# Patient Record
Sex: Female | Born: 1990
Health system: Southern US, Community
[De-identification: ages and names within clinical notes are randomized; demographics above are authoritative.]

## PROBLEM LIST (undated history)

## (undated) DIAGNOSIS — Z8619 Personal history of other infectious and parasitic diseases: Secondary | ICD-10-CM

## (undated) DIAGNOSIS — Z789 Other specified health status: Secondary | ICD-10-CM

## (undated) HISTORY — PX: NO PAST SURGERIES: SHX2092

## (undated) HISTORY — DX: Personal history of other infectious and parasitic diseases: Z86.19

## (undated) HISTORY — DX: Other specified health status: Z78.9

---
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
## 2008-11-09 ENCOUNTER — Emergency Department (HOSPITAL_COMMUNITY): Admission: EM | Admit: 2008-11-09 | Discharge: 2008-11-09 | Payer: Self-pay | Admitting: Emergency Medicine
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
## 2010-03-31 ENCOUNTER — Emergency Department (HOSPITAL_COMMUNITY): Admission: EM | Admit: 2010-03-31 | Discharge: 2010-03-31 | Payer: Self-pay | Admitting: Emergency Medicine

## 2010-06-05 ENCOUNTER — Emergency Department (HOSPITAL_COMMUNITY): Admission: EM | Admit: 2010-06-05 | Discharge: 2010-01-24 | Payer: Self-pay | Admitting: Emergency Medicine

## 2010-10-07 LAB — POCT PREGNANCY, URINE: Preg Test, Ur: NEGATIVE

## 2018-04-13 DIAGNOSIS — Z6830 Body mass index (BMI) 30.0-30.9, adult: Secondary | ICD-10-CM | POA: Diagnosis not present

## 2018-05-10 DIAGNOSIS — Z6834 Body mass index (BMI) 34.0-34.9, adult: Secondary | ICD-10-CM | POA: Diagnosis not present

## 2018-05-10 DIAGNOSIS — Z01419 Encounter for gynecological examination (general) (routine) without abnormal findings: Secondary | ICD-10-CM | POA: Diagnosis not present

## 2018-05-10 DIAGNOSIS — Z124 Encounter for screening for malignant neoplasm of cervix: Secondary | ICD-10-CM | POA: Diagnosis not present

## 2018-05-11 ENCOUNTER — Encounter: Payer: Self-pay | Admitting: Family Medicine

## 2018-05-11 ENCOUNTER — Ambulatory Visit (INDEPENDENT_AMBULATORY_CARE_PROVIDER_SITE_OTHER): Payer: BLUE CROSS/BLUE SHIELD | Admitting: Family Medicine

## 2018-05-11 VITALS — BP 122/80 | HR 83 | Temp 98.9°F | Resp 12 | Ht 65.0 in | Wt 214.2 lb

## 2018-05-11 DIAGNOSIS — I499 Cardiac arrhythmia, unspecified: Secondary | ICD-10-CM | POA: Diagnosis not present

## 2018-05-11 DIAGNOSIS — Z23 Encounter for immunization: Secondary | ICD-10-CM

## 2018-05-11 DIAGNOSIS — Z124 Encounter for screening for malignant neoplasm of cervix: Secondary | ICD-10-CM | POA: Diagnosis not present

## 2018-05-11 DIAGNOSIS — E669 Obesity, unspecified: Secondary | ICD-10-CM

## 2018-05-11 DIAGNOSIS — F17200 Nicotine dependence, unspecified, uncomplicated: Secondary | ICD-10-CM | POA: Diagnosis not present

## 2018-05-11 DIAGNOSIS — E66812 Obesity, class 2: Secondary | ICD-10-CM | POA: Insufficient documentation

## 2018-05-11 NOTE — Progress Notes (Signed)
HPI:   Dawn Conley is a 27 y.o. female, who is here today to establish care.  Former PCP: N/A Last preventive routine visit: Gyn preventive visit in 04/2018.  Chronic medical problems: Headaches,which she states improved after stopping multivitamins. She had labs through employer and she was told they were "fine." +Tobacco use.  She has no concerns today.  On examination noted mild arrhythmia. She denies chest pain,palpitations,dyspnea,diaphoresis,or dizziness.  Negative for abnormal wt loss, instead she reports gaining wt . No fever,chills,abdominal pain,changes in bowel habits,or tremor.   She does not exercise regularly and does not follow a healthful diet.  She does not have concerns today.     Review of Systems  Constitutional: Negative for activity change, appetite change, fatigue and fever.  HENT: Negative for mouth sores, nosebleeds and trouble swallowing.   Eyes: Negative for redness and visual disturbance.  Respiratory: Negative for cough, shortness of breath and wheezing.   Cardiovascular: Negative for chest pain, palpitations and leg swelling.  Gastrointestinal: Negative for abdominal pain, nausea and vomiting.       Negative for changes in bowel habits.  Endocrine: Negative for cold intolerance and heat intolerance.  Genitourinary: Negative for decreased urine volume, dysuria and hematuria.  Neurological: Negative for syncope, weakness and headaches.      No current outpatient medications on file prior to visit.   No current facility-administered medications on file prior to visit.      Past Medical History:  Diagnosis Date  . History of chicken pox    Allergies  Allergen Reactions  . Aspirin Hives    Family History  Problem Relation Age of Onset  . Drug abuse Mother   . Drug abuse Father   . Alcohol abuse Father   . Early death Father   . Heart disease Father   . Heart attack Father   . Diabetes Maternal Grandmother    . Hypertension Maternal Grandmother   . Stroke Maternal Grandfather     Social History   Socioeconomic History  . Marital status: Single    Spouse name: Not on file  . Number of children: Not on file  . Years of education: Not on file  . Highest education level: Not on file  Occupational History  . Not on file  Social Needs  . Financial resource strain: Not on file  . Food insecurity:    Worry: Not on file    Inability: Not on file  . Transportation needs:    Medical: Not on file    Non-medical: Not on file  Tobacco Use  . Smoking status: Current Every Day Smoker  . Smokeless tobacco: Never Used  Substance and Sexual Activity  . Alcohol use: Yes  . Drug use: Never  . Sexual activity: Not Currently  Lifestyle  . Physical activity:    Days per week: Not on file    Minutes per session: Not on file  . Stress: Not on file  Relationships  . Social connections:    Talks on phone: Not on file    Gets together: Not on file    Attends religious service: Not on file    Active member of club or organization: Not on file    Attends meetings of clubs or organizations: Not on file    Relationship status: Not on file  Other Topics Concern  . Not on file  Social History Narrative  . Not on file    Vitals:   05/11/18  1533  BP: 122/80  Pulse: 83  Resp: 12  Temp: 98.9 F (37.2 C)  SpO2: 95%    Body mass index is 35.65 kg/m.   Physical Exam  Nursing note and vitals reviewed. Constitutional: She is oriented to person, place, and time. She appears well-developed. No distress.  HENT:  Head: Normocephalic and atraumatic.  Mouth/Throat: Oropharynx is clear and moist and mucous membranes are normal.  Eyes: Pupils are equal, round, and reactive to light. Conjunctivae are normal.  Cardiovascular: Normal rate. An irregular rhythm present.  Occasional extrasystoles are present.  No murmur heard. Pulses:      Dorsalis pedis pulses are 2+ on the right side, and 2+ on the left  side.  Respiratory: Effort normal and breath sounds normal. No respiratory distress.  GI: Soft. She exhibits no mass. There is no hepatomegaly. There is no tenderness.  Musculoskeletal: She exhibits no edema.  Lymphadenopathy:    She has no cervical adenopathy.  Neurological: She is alert and oriented to person, place, and time. She has normal strength. No cranial nerve deficit. Gait normal.  Skin: Skin is warm. No rash noted. No erythema.  Psychiatric: She has a normal mood and affect.  Well groomed, good eye contact.      ASSESSMENT AND PLAN:   Dawn Conley was seen today for establish care.  Diagnoses and all orders for this visit:  Irregular heart rate Mild and asymptomatic. Possible etiologies discussed. EKG today sinus arrhythmia, LAD, +artefact. Some T wave abnormalities inferior leads, rest otherwise normal.No another EKG available for comparison.  No further work up ordered today. She had labs at work 01/2018, will fax results tomorrow. Will bring her back for lab work if needed.  Instructed about warning signs.  -     EKG 12-Lead  Obesity, Class II, BMI 35-39.9 We discussed benefits of wt loss as well as adverse effects of obesity. Consistency with healthy diet and physical activity recommended.  Tobacco use disorder Adverse effects of tobacco use and benefit os smoking cessation discussed. She is not interested in smoking cessation at this time.   Need for Tdap vaccination -     Tdap vaccine greater than or equal to 7yo IM     Betty G. [place], MD  [facility]. [place] office.

## 2018-05-11 NOTE — Patient Instructions (Addendum)
A few things to remember from today's visit:   Irregular heart rate - Plan: EKG 12-Lead   Mediterranean Diet A Mediterranean diet refers to food and lifestyle choices that are based on the traditions of countries located on the [place]. This way of eating has been shown to help prevent certain conditions and improve outcomes for people who have chronic diseases, like kidney disease and heart disease. What are tips for following this plan? Lifestyle  Cook and eat meals together with your family, when possible.  Drink enough fluid to keep your urine clear or pale yellow.  Be physically active every day. This includes: ? Aerobic exercise like running or swimming. ? Leisure activities like gardening, walking, or housework.  Get 7-8 hours of sleep each night.  If recommended by your health care provider, drink red wine in moderation. This means 1 glass a day for nonpregnant women and 2 glasses a day for men. A glass of wine equals 5 oz (150 mL). Reading food labels  Check the serving size of packaged foods. For foods such as rice and pasta, the serving size refers to the amount of cooked product, not dry.  Check the total fat in packaged foods. Avoid foods that have saturated fat or trans fats.  Check the ingredients list for added sugars, such as corn syrup. Shopping  At the grocery store, buy most of your food from the areas near the walls of the store. This includes: ? Fresh fruits and vegetables (produce). ? Grains, beans, nuts, and seeds. Some of these may be available in unpackaged forms or large amounts (in bulk). ? Fresh seafood. ? Poultry and eggs. ? Low-fat dairy products.  Buy whole ingredients instead of prepackaged foods.  Buy fresh fruits and vegetables in-season from local farmers markets.  Buy frozen fruits and vegetables in resealable bags.  If you do not have access to quality fresh seafood, buy precooked frozen shrimp or canned fish, such as tuna,  salmon, or sardines.  Buy small amounts of raw or cooked vegetables, salads, or olives from the deli or salad bar at your store.  Stock your pantry so you always have certain foods on hand, such as olive oil, canned tuna, canned tomatoes, rice, pasta, and beans. Cooking  Cook foods with extra-virgin olive oil instead of using butter or other vegetable oils.  Have meat as a side dish, and have vegetables or grains as your main dish. This means having meat in small portions or adding small amounts of meat to foods like pasta or stew.  Use beans or vegetables instead of meat in common dishes like chili or lasagna.  Experiment with different cooking methods. Try roasting or broiling vegetables instead of steaming or sauteing them.  Add frozen vegetables to soups, stews, pasta, or rice.  Add nuts or seeds for added healthy fat at each meal. You can add these to yogurt, salads, or vegetable dishes.  Marinate fish or vegetables using olive oil, lemon juice, garlic, and fresh herbs. Meal planning  Plan to eat 1 vegetarian meal one day each week. Try to work up to 2 vegetarian meals, if possible.  Eat seafood 2 or more times a week.  Have healthy snacks readily available, such as: ? Vegetable sticks with hummus. ? [place] yogurt. ? Fruit and nut trail mix.  Eat balanced meals throughout the week. This includes: ? Fruit: 2-3 servings a day ? Vegetables: 4-5 servings a day ? Low-fat dairy: 2 servings a day ? Fish, poultry,  or lean meat: 1 serving a day ? Beans and legumes: 2 or more servings a week ? Nuts and seeds: 1-2 servings a day ? Whole grains: 6-8 servings a day ? Extra-virgin olive oil: 3-4 servings a day  Limit red meat and sweets to only a few servings a month What are my food choices?  Mediterranean diet ? Recommended ? Grains: Whole-grain pasta. Brown rice. Bulgar wheat. Polenta. Couscous. Whole-wheat bread. Orpah Cobb. ? Vegetables: Artichokes. Beets. Broccoli.  Cabbage. Carrots. Eggplant. Green beans. Chard. Kale. Spinach. Onions. Leeks. Peas. Squash. Tomatoes. Peppers. Radishes. ? Fruits: Apples. Apricots. Avocado. Berries. Bananas. Cherries. Dates. Figs. Grapes. Lemons. Melon. Oranges. Peaches. Plums. Pomegranate. ? Meats and other protein foods: Beans. Almonds. Sunflower seeds. Pine nuts. Peanuts. Cod. Salmon. Scallops. Shrimp. Tuna. Tilapia. Clams. Oysters. Eggs. ? Dairy: Low-fat milk. Cheese. Greek yogurt. ? Beverages: Water. Red wine. Herbal tea. ? Fats and oils: Extra virgin olive oil. Avocado oil. Grape seed oil. ? Sweets and desserts: [place] yogurt with honey. Baked apples. Poached pears. Trail mix. ? Seasoning and other foods: Basil. Cilantro. Coriander. Cumin. Mint. Parsley. Sage. Rosemary. Tarragon. Garlic. Oregano. Thyme. Pepper. Balsalmic vinegar. Tahini. Hummus. Tomato sauce. Olives. Mushrooms. ? Limit these ? Grains: Prepackaged pasta or rice dishes. Prepackaged cereal with added sugar. ? Vegetables: Deep fried potatoes (french fries). ? Fruits: Fruit canned in syrup. ? Meats and other protein foods: Beef. Pork. Lamb. Poultry with skin. Hot dogs. Tomasa Blase. ? Dairy: Ice cream. Sour cream. Whole milk. ? Beverages: Juice. Sugar-sweetened soft drinks. Beer. Liquor and spirits. ? Fats and oils: Butter. Canola oil. Vegetable oil. Beef fat (tallow). Lard. ? Sweets and desserts: Cookies. Cakes. Pies. Candy. ? Seasoning and other foods: Mayonnaise. Premade sauces and marinades. ? The items listed may not be a complete list. Talk with your dietitian about what dietary choices are right for you. Summary  The Mediterranean diet includes both food and lifestyle choices.  Eat a variety of fresh fruits and vegetables, beans, nuts, seeds, and whole grains.  Limit the amount of red meat and sweets that you eat.  Talk with your health care provider about whether it is safe for you to drink red wine in moderation. This means 1 glass a day for  nonpregnant women and 2 glasses a day for men. A glass of wine equals 5 oz (150 mL). This information is not intended to replace advice given to you by your health care provider. Make sure you discuss any questions you have with your health care provider. Document Released: 02/06/2016 Document Revised: 03/10/2016 Document Reviewed: 02/06/2016 Elsevier Interactive Patient Education  [employer].   Please be sure medication list is accurate. If a new problem present, please set up appointment sooner than planned today.

## 2018-05-12 DIAGNOSIS — F17200 Nicotine dependence, unspecified, uncomplicated: Secondary | ICD-10-CM | POA: Insufficient documentation

## 2019-06-19 ENCOUNTER — Ambulatory Visit: Payer: Managed Care, Other (non HMO) | Attending: Internal Medicine

## 2019-06-19 DIAGNOSIS — Z20822 Contact with and (suspected) exposure to covid-19: Secondary | ICD-10-CM

## 2019-06-20 LAB — NOVEL CORONAVIRUS, NAA: SARS-CoV-2, NAA: NOT DETECTED

## 2021-06-29 NOTE — L&D Delivery Note (Signed)
Delivery Note    Patient Name: Dawn Conley DOB: [birth year] MRN: [id number]  Date of admission: 02/28/2022 Delivering MD: Dale Omar  Date of delivery: 03/01/2022 Type of delivery: SVD  Newborn Data: Live born female  Birth Weight:   APGAR: 8, 9  Newborn Delivery   Birth date/time: 03/01/2022 00:02:00 Delivery type: Vaginal, Spontaneous      Dawn Conley, 31 y.o., @ [redacted]w[redacted]d,  G4P0030, who was admitted for IOL for NRFHT. I was called to the room when she progressed +1 station in the second stage of labor.  She pushed for 20/min.  She delivered a viable infant, cephalic and restituted to the LOA position over an intact perineum.  A tight nuchal cord   was identified and unable to reduced NB summersault through and x1 loose body cord. The baby was placed on maternal abdomen while initial step of NRP were perfmored (Dry, Stimulated, and warmed). Hat placed on baby for thermoregulation. Delayed cord clamping was performed for 2 minutes.  Cord double clamped and cut.  Cord cut by FOB. Apgar scores were 8 and 9. Prophylactic Pitocin was started in the third stage of labor for active management. The placenta delivered spontaneously, shultz, with a 3 vessel cord and was sent home with pt.  Inspection revealed 2nd degree and with sulcus, brisk bleeding was noted, TXA hung r/t brisk bleeding, but most likely blood loss was from the sulcus. An examination of the vaginal vault and cervix was free from lacerations. The uterus was firm, bleeding stable.  The repair was done under epidural and lidocaine.  Umbilical artery blood gas were not sent.  There were no complications during the procedure.  Mom and baby skin to skin following delivery. Left in stable condition.  Maternal Info: Anesthesia: Epidural Episiotomy: no Lacerations:  2nd with sulcus Suture Repair: 2.0 vicryl Est. Blood Loss (mL):   Newborn Info:  Baby Sex: female Circumcision: in pt desired APGAR (1 MIN): 8   APGAR  (5 MINS): 9   APGAR (10 MINS):    Dr Richardson Dopp was texted updates. Pt stable.   Mom to postpartum.  Baby to Couplet care / Skin to Skin.   [facility] CNM, FNP-C, PMHNP-BC  [address]  [place], [place] [address]  Cell: [phone]  Office Phone: [phone] Fax: [phone] 03/01/2022  12:43 AM

## 2021-07-24 LAB — OB RESULTS CONSOLE HEPATITIS B SURFACE ANTIGEN: Hepatitis B Surface Ag: NEGATIVE

## 2021-07-24 LAB — OB RESULTS CONSOLE HIV ANTIBODY (ROUTINE TESTING): HIV: NONREACTIVE

## 2022-02-28 ENCOUNTER — Inpatient Hospital Stay (HOSPITAL_COMMUNITY): Payer: No Typology Code available for payment source | Admitting: Anesthesiology

## 2022-02-28 ENCOUNTER — Inpatient Hospital Stay (HOSPITAL_COMMUNITY)
Admission: AD | Admit: 2022-02-28 | Discharge: 2022-03-03 | DRG: 807 | Disposition: A | Discharge status: Home / Self Care | Payer: No Typology Code available for payment source | Attending: Obstetrics and Gynecology | Admitting: Obstetrics and Gynecology

## 2022-02-28 ENCOUNTER — Encounter (HOSPITAL_COMMUNITY): Payer: Self-pay | Admitting: Obstetrics and Gynecology

## 2022-02-28 DIAGNOSIS — O480 Post-term pregnancy: Secondary | ICD-10-CM | POA: Diagnosis present

## 2022-02-28 DIAGNOSIS — Z3490 Encounter for supervision of normal pregnancy, unspecified, unspecified trimester: Secondary | ICD-10-CM | POA: Diagnosis present

## 2022-02-28 DIAGNOSIS — O26893 Other specified pregnancy related conditions, third trimester: Secondary | ICD-10-CM | POA: Diagnosis present

## 2022-02-28 DIAGNOSIS — F17200 Nicotine dependence, unspecified, uncomplicated: Secondary | ICD-10-CM | POA: Diagnosis present

## 2022-02-28 DIAGNOSIS — O368390 Maternal care for abnormalities of the fetal heart rate or rhythm, unspecified trimester, not applicable or unspecified: Secondary | ICD-10-CM | POA: Diagnosis present

## 2022-02-28 DIAGNOSIS — O99334 Smoking (tobacco) complicating childbirth: Secondary | ICD-10-CM | POA: Diagnosis present

## 2022-02-28 DIAGNOSIS — O99214 Obesity complicating childbirth: Secondary | ICD-10-CM | POA: Diagnosis present

## 2022-02-28 DIAGNOSIS — Z3A40 40 weeks gestation of pregnancy: Secondary | ICD-10-CM | POA: Diagnosis not present

## 2022-02-28 LAB — CBC
HCT: 35.2 % — ABNORMAL LOW (ref 36.0–46.0)
Hemoglobin: 12.2 g/dL (ref 12.0–15.0)
MCH: 31.6 pg (ref 26.0–34.0)
MCHC: 34.7 g/dL (ref 30.0–36.0)
MCV: 91.2 fL (ref 80.0–100.0)
Platelets: 209 10*3/uL (ref 150–400)
RBC: 3.86 MIL/uL — ABNORMAL LOW (ref 3.87–5.11)
RDW: 13.7 % (ref 11.5–15.5)
WBC: 11 10*3/uL — ABNORMAL HIGH (ref 4.0–10.5)
nRBC: 0 % (ref 0.0–0.2)

## 2022-02-28 LAB — TYPE AND SCREEN
ABO/RH(D): O POS
Antibody Screen: NEGATIVE

## 2022-02-28 LAB — RPR: RPR Ser Ql: NONREACTIVE

## 2022-02-28 MED ORDER — FENTANYL CITRATE (PF) 100 MCG/2ML IJ SOLN
50.0000 ug | INTRAMUSCULAR | Status: DC | PRN
  Administered 2022-02-28: 100 ug via INTRAVENOUS
  Filled 2022-02-28: qty 2

## 2022-02-28 MED ORDER — OXYTOCIN-SODIUM CHLORIDE 30-0.9 UT/500ML-% IV SOLN
2.5000 [IU]/h | INTRAVENOUS | Status: DC
  Administered 2022-03-01: 2.5 [IU]/h via INTRAVENOUS
  Filled 2022-02-28: qty 500

## 2022-02-28 MED ORDER — ACETAMINOPHEN 325 MG PO TABS: 650.0000 mg | ORAL_TABLET | ORAL | Status: DC | PRN

## 2022-02-28 MED ORDER — TERBUTALINE SULFATE 1 MG/ML IJ SOLN: 0.2500 mg | Freq: Once | INTRAMUSCULAR | Status: DC | PRN

## 2022-02-28 MED ORDER — LACTATED RINGERS IV SOLN: 500.0000 mL | Freq: Once | INTRAVENOUS | Status: DC

## 2022-02-28 MED ORDER — LACTATED RINGERS IV SOLN
500.0000 mL | INTRAVENOUS | Status: DC | PRN
  Administered 2022-02-28: 1000 mL via INTRAVENOUS

## 2022-02-28 MED ORDER — FENTANYL-BUPIVACAINE-NACL 0.5-0.125-0.9 MG/250ML-% EP SOLN
12.0000 mL/h | EPIDURAL | Status: DC | PRN
  Administered 2022-02-28: 12 mL/h via EPIDURAL
  Filled 2022-02-28: qty 250

## 2022-02-28 MED ORDER — OXYCODONE-ACETAMINOPHEN 5-325 MG PO TABS: 1.0000 | ORAL_TABLET | ORAL | Status: DC | PRN

## 2022-02-28 MED ORDER — PHENYLEPHRINE 80 MCG/ML (10ML) SYRINGE FOR IV PUSH (FOR BLOOD PRESSURE SUPPORT)
80.0000 ug | PREFILLED_SYRINGE | INTRAVENOUS | Status: DC | PRN
  Filled 2022-02-28: qty 10

## 2022-02-28 MED ORDER — ONDANSETRON HCL 4 MG/2ML IJ SOLN
4.0000 mg | Freq: Four times a day (QID) | INTRAMUSCULAR | Status: DC | PRN
Start: 2022-02-28 — End: 2022-03-01
  Administered 2022-02-28: 4 mg via INTRAVENOUS
  Filled 2022-02-28: qty 2

## 2022-02-28 MED ORDER — OXYCODONE-ACETAMINOPHEN 5-325 MG PO TABS: 2.0000 | ORAL_TABLET | ORAL | Status: DC | PRN

## 2022-02-28 MED ORDER — HYDROXYZINE HCL 50 MG PO TABS
50.0000 mg | ORAL_TABLET | Freq: Four times a day (QID) | ORAL | Status: DC | PRN
Start: 2022-02-28 — End: 2022-03-01

## 2022-02-28 MED ORDER — OXYTOCIN BOLUS FROM INFUSION
333.0000 mL | Freq: Once | INTRAVENOUS | Status: AC
  Administered 2022-03-01: 333 mL via INTRAVENOUS

## 2022-02-28 MED ORDER — TRANEXAMIC ACID-NACL 1000-0.7 MG/100ML-% IV SOLN
1000.0000 mg | INTRAVENOUS | Status: AC
Start: 2022-03-01 — End: 2022-03-01
  Administered 2022-03-01: 1000 mg via INTRAVENOUS
  Filled 2022-02-28: qty 100

## 2022-02-28 MED ORDER — EPHEDRINE 5 MG/ML INJ
10.0000 mg | INTRAVENOUS | Status: DC | PRN
  Filled 2022-02-28: qty 5

## 2022-02-28 MED ORDER — SOD CITRATE-CITRIC ACID 500-334 MG/5ML PO SOLN
30.0000 mL | ORAL | Status: DC | PRN
Start: 2022-02-28 — End: 2022-03-01

## 2022-02-28 MED ORDER — OXYTOCIN-SODIUM CHLORIDE 30-0.9 UT/500ML-% IV SOLN: 1.0000 m[IU]/min | INTRAVENOUS | Status: DC

## 2022-02-28 MED ORDER — FLEET ENEMA 7-19 GM/118ML RE ENEM: 1.0000 | ENEMA | RECTAL | Status: DC | PRN

## 2022-02-28 MED ORDER — DIPHENHYDRAMINE HCL 50 MG/ML IJ SOLN: 12.5000 mg | INTRAMUSCULAR | Status: DC | PRN

## 2022-02-28 MED ORDER — PHENYLEPHRINE 80 MCG/ML (10ML) SYRINGE FOR IV PUSH (FOR BLOOD PRESSURE SUPPORT): 80.0000 ug | PREFILLED_SYRINGE | INTRAVENOUS | Status: DC | PRN

## 2022-02-28 MED ORDER — LIDOCAINE HCL (PF) 1 % IJ SOLN
30.0000 mL | INTRAMUSCULAR | Status: AC | PRN
  Administered 2022-03-01: 30 mL via SUBCUTANEOUS
  Filled 2022-02-28: qty 30

## 2022-02-28 MED ORDER — EPHEDRINE 5 MG/ML INJ: 10.0000 mg | INTRAVENOUS | Status: DC | PRN

## 2022-02-28 MED ORDER — LIDOCAINE HCL (PF) 1 % IJ SOLN
INTRAMUSCULAR | Status: DC | PRN
  Administered 2022-02-28 (×2): 4 mL via EPIDURAL

## 2022-02-28 MED ORDER — LACTATED RINGERS IV SOLN: INTRAVENOUS | Status: DC

## 2022-02-28 NOTE — Progress Notes (Signed)
   Labor Progress Note  Dawn Sa'Mone Nardelli, 31 y.o., G4P0030, with an IUP @ [redacted]w[redacted]d,  presenting for labor eval Ctx began @ 0200 9/2, NRFHT noted pt sent to LD for IOL. Pregnancy complicated by maternal obesity and hx of miscarriage, and hx of uterine fibroids x2.   Subjective: Pt comfortable with epidural progressing naturally in latent labor, reviewed POC, pt progress naturally currently in latent labor. Reviewed R/B/A of starting pitocin 1x1 if cxt space, pt verbalized consent.  Patient Active Problem List   Diagnosis Date Noted   Normal labor 02/28/2022   Tobacco use disorder 05/12/2018   Obesity, Class II, BMI 35-39.9 05/11/2018   Objective: BP 120/74   Pulse 80   Temp 98.1 F (36.7 C) (Oral)   Resp 18   Ht 5\' 6"  (1.676 m)   Wt 110 kg   SpO2 98%   BMI 39.14 kg/m  No intake/output data recorded. No intake/output data recorded. NST: FHR baseline 120 bpm, Variability: moderate, Accelerations:present, Decelerations:  Absent= Cat 1/Reactive CTX:  irregular, every 2-5 minutes Uterus gravid, soft non tender, moderate to palpate with contractions.  SVE:  Dilation: 4.5 Effacement (%): 100 Station: -2 Exam by:: [facility], CNM Pitocin at N/A mUn/min  Assessment:  Dawn Conley, 31 y.o., G4P0030, with an IUP @ [redacted]w[redacted]d,  presenting for labor eval Ctx began @ 0200 9/2, NRFHT noted pt sent to LD for IOL. Pregnancy complicated by maternal obesity and hx of miscarriage, and hx of uterine fibroids x2. Pt progressing naturally in latent labor and comfortable with epidural.  Patient Active Problem List   Diagnosis Date Noted   Normal labor 02/28/2022   Tobacco use disorder 05/12/2018   Obesity, Class II, BMI 35-39.9 05/11/2018   NICHD: Category 1  Membranes: Intact, no s/s of infection  Induction:    Cytotec x N/A  Foley Bulb: inserted  N/A  Pitocin - N/A  Pain management:               IV pain management: x Fentanyl @ 1432 on 9/2  Nitrous: PRN             Epidural  placement:  at 1548 on 9/2  GBS Negative   Plan: Continue labor plan Continuous monitoring Rest Frequent position changes to facilitate fetal rotation and descent. Will reassess with cervical exam at 4 hoursor earlier if necessary May start pitocin per protocol 1x1 if cxt space and no cervical change is noted.  Anticipate labor progression and vaginal delivery.   Md Shawnee aware of plan and verbalized agreement.   [facility] CNM, FNP-C, PMHNP-BC  [address]  [place], [place]  Cell: [phone]  Office Phone: [phone] Fax: [phone] 02/28/2022  10:27 PM

## 2022-02-28 NOTE — Progress Notes (Signed)
Dawn Conley is a 31 y.o. G2P0010 at [redacted]w[redacted]d by  admitted for induction of labor due to Non-reactive NST.  Subjective: Patient reports painful contractions. No lof no vaginal bleeding. +FM   Objective: BP 121/71   Pulse 68   Temp 97.8 F (36.6 C) (Oral)   Resp 18   Ht 5\' 6"  (1.676 m)   Wt 110 kg   SpO2 98%   BMI 39.14 kg/m  No intake/output data recorded. No intake/output data recorded.  FHT:  FHR: 120 bpm, variability: moderate,  accelerations:  Present,  decelerations:  Present occasional variable  UC:   regular, every 2-3 minutes SVE:   Dilation: 2.5 Effacement (%): 80 Station: Ballotable Exam by:: Dr. [email or web address]  Labs: Lab Results  Component Value Date   WBC 11.0 (H) 02/28/2022   HGB 12.2 02/28/2022   HCT 35.2 (L) 02/28/2022   MCV 91.2 02/28/2022   PLT 209 02/28/2022    Assessment / Plan: Latent labor progressing spontaneously pt admitted due to nonreassuring NST ( variable decels)   Labor: Progressing normally plan to recheck cervix in 2-3 hours if no change start pitocin 1x1 Preeclampsia:   NA Fetal Wellbeing:  Category I overall episodes of category 2 that resolve with repositioning  Pain Control:  Labor support without medications I/D:  n/a Anticipated MOD:  NSVD  04/30/2022, MD 02/28/2022, 12:58 PM

## 2022-02-28 NOTE — H&P (Signed)
OB ADMISSION/ HISTORY & PHYSICAL:  Admission Date: 02/28/2022  3:52 AM  Admit Diagnosis: Normal labor  Dawn Conley is a 31 y.o. female G2P0010 [redacted]w[redacted]d presenting for labor eval. Endorses active FM, denies LOF and vaginal bleeding. Ctx began @ 0200. Pregnancy complicated by maternal obesity and hx of miscarriage, and hx of uterine fibroids x2.   History of current pregnancy: G2P0010   Patient entered care with [facility] at 11 wks.   EDC 02/24/22 by LMP and congruent w/ 9+5 wk U/S.   Anatomy scan:  complete w/ fundal placenta.   Antenatal testing: for maternal obesity started at 32 weeks  Last evaluation: 39+5 wks vertex/ fundal placenta/ AFI 13/ EFW 9+1 (96%) Significant prenatal events:  Patient Active Problem List   Diagnosis Date Noted   Tobacco use disorder 05/12/2018   Obesity, Class II, BMI 35-39.9 05/11/2018    Prenatal Labs: ABO, Rh:   Antibody:   Rubella:   immune RPR:   NR HBsAg:   NR HIV:   NR GTT: normal 1 hr GBS:   neg GC/CHL: neg/neg Genetics: declined Vaccines: Tdap: declines Influenza: declined   OB History  Gravida Para Term Preterm AB Living  2 0   0 1 0  SAB IAB Ectopic Multiple Live Births  1       0    # Outcome Date GA Lbr Len/2nd Weight Sex Delivery Anes PTL Lv  2 Current           1 SAB             Medical / Surgical History: Past medical history:  Past Medical History:  Diagnosis Date   History of chicken pox    Medical history non-contributory     Past surgical history:  Past Surgical History:  Procedure Laterality Date   NO PAST SURGERIES     Family History:  Family History  Problem Relation Age of Onset   Drug abuse Mother    Drug abuse Father    Alcohol abuse Father    Early death Father    Heart disease Father    Heart attack Father    Diabetes Maternal Grandmother    Hypertension Maternal Grandmother    Stroke Maternal Grandfather     Social History:  reports that she has been smoking. She has never used smokeless  tobacco. She reports current alcohol use. She reports that she does not use drugs.  Allergies: Aspirin   Current Medications at time of admission:  Prior to Admission medications   Medication Sig Start Date End Date Taking? Authorizing Provider  Prenatal Vit-Fe Fumarate-FA (PRENATAL MULTIVITAMIN) TABS tablet Take 1 tablet by mouth daily at 12 noon.   Yes [provider]    Review of Systems: Constitutional: Negative   HENT: Negative   Eyes: Negative   Respiratory: Negative   Cardiovascular: Negative   Gastrointestinal: Negative  Genitourinary: neg for bloody show, neg for LOF   Musculoskeletal: Negative   Skin: Negative   Neurological: Negative   Endo/Heme/Allergies: Negative   Psychiatric/Behavioral: Negative    Physical Exam: VS: Blood pressure 123/79, pulse 79, temperature 97.8 F (36.6 C), temperature source Oral, resp. rate 18, height 5\' 6"  (1.676 m), weight 110 kg. AAO x3, no signs of distress Cardiovascular: RRR Respiratory: Unlabored GU/GI: Abdomen gravid, non-tender, non-distended, active FM, vertex, EFW 8+5 per Leopold's Extremities: trace edema, negative for pain, tenderness, and cords  Cervical exam:Dilation: 1.5 Effacement (%): 50 Station: Ballotable Exam by:: Danielle, CNM FHR: baseline  rate 120 / variability moderate / accelerations present / absent decelerations TOCO: 3-5   Prenatal Transfer Tool  Maternal Diabetes: No Genetic Screening: Declined Maternal Ultrasounds/Referrals: Normal Fetal Ultrasounds or other Referrals:  None Maternal Substance Abuse:  Yes:  Type: Smoker Significant Maternal Medications:  None Significant Maternal Lab Results: Group B Strep negative    Assessment: 31 y.o. G2P0010 [redacted]w[redacted]d IOL for late term and fetal surveillance  Latent stage of labor FHR category 1 and 2 GBS neg Pain management plan: IV sedation and epidural   Plan:  Admit to L&D Routine admission orders Epidural PRN Cervical ripening/  Pitocin Pt presented to Dr Richardson Dopp and plan of care developed.   Roma Schanz DNP, CNM 02/28/2022 7:28 AM

## 2022-02-28 NOTE — Anesthesia Preprocedure Evaluation (Signed)
Anesthesia Evaluation  Patient identified by MRN, date of birth, ID band Patient awake    Reviewed: Allergy & Precautions, Patient's Chart, lab work & pertinent test results  History of Anesthesia Complications Negative for: history of anesthetic complications  Airway Mallampati: II  TM Distance: >3 FB Neck ROM: Full    Dental no notable dental hx.    Pulmonary Current Smoker,    Pulmonary exam normal        Cardiovascular negative cardio ROS Normal cardiovascular exam     Neuro/Psych negative neurological ROS  negative psych ROS   GI/Hepatic negative GI ROS, Neg liver ROS,   Endo/Other  negative endocrine ROS  Renal/GU negative Renal ROS  negative genitourinary   Musculoskeletal negative musculoskeletal ROS (+)   Abdominal   Peds  Hematology negative hematology ROS (+)   Anesthesia Other Findings Day of surgery medications reviewed with patient.  Reproductive/Obstetrics (+) Pregnancy                             Anesthesia Physical Anesthesia Plan  ASA: 2  Anesthesia Plan: Epidural   Post-op Pain Management:    Induction:   PONV Risk Score and Plan: Treatment may vary due to age or medical condition  Airway Management Planned: Natural Airway  Additional Equipment: Fetal Monitoring  Intra-op Plan:   Post-operative Plan:   Informed Consent: I have reviewed the patients History and Physical, chart, labs and discussed the procedure including the risks, benefits and alternatives for the proposed anesthesia with the patient or authorized representative who has indicated his/her understanding and acceptance.       Plan Discussed with:   Anesthesia Plan Comments:         Anesthesia Quick Evaluation  

## 2022-02-28 NOTE — Progress Notes (Signed)
  Subjective: Pateint is comfortable with her epidural. Some pain on the left side.   Objective: BP 115/69   Pulse 75   Temp 97.8 F (36.6 C) (Oral)   Resp 18   Ht 5\' 6"  (1.676 m)   Wt 110 kg   SpO2 98%   BMI 39.14 kg/m  No intake/output data recorded. No intake/output data recorded.  FHT:  FHR: 130 bpm, variability: moderate,  accelerations:  Present,  decelerations:  Absent UC:   irregular, every 2-5 minutes SVE:   Dilation: 4 Effacement (%): 80 Station: -3 Exam by:: Dr. [email or web address]  Labs: Lab Results  Component Value Date   WBC 11.0 (H) 02/28/2022   HGB 12.2 02/28/2022   HCT 35.2 (L) 02/28/2022   MCV 91.2 02/28/2022   PLT 209 02/28/2022    Assessment / Plan: Spontaneous labor, progressing normally  Labor: Progressing normally Preeclampsia:   NA Fetal Wellbeing:  Category I currently previously episodes of prolong variables that resolved with repositioning. Will monitor closely  Pain Control:  Epidural I/D:  n/a Anticipated MOD:   undetermined   04/30/2022, MD 02/28/2022, 4:47 PM

## 2022-02-28 NOTE — MAU Note (Signed)
Pt says strong UC's since 10 pm [facility]- [facility]  No VE Denies HSV GBS- neg

## 2022-02-28 NOTE — Anesthesia Procedure Notes (Signed)
Epidural Patient location during procedure: OB Start time: 02/28/2022 3:45 PM End time: 02/28/2022 3:48 PM  Staffing Anesthesiologist: Kaylyn Layer, MD Performed: anesthesiologist   Preanesthetic Checklist Completed: patient identified, IV checked, risks and benefits discussed, monitors and equipment checked, pre-op evaluation and timeout performed  Epidural Patient position: sitting Prep: DuraPrep and site prepped and draped Patient monitoring: continuous pulse ox, blood pressure and heart rate Approach: midline Location: L3-L4 Injection technique: LOR air  Needle:  Needle type: Tuohy  Needle gauge: 17 G Needle length: 9 cm Needle insertion depth: 6 cm Catheter type: closed end flexible Catheter size: 19 Gauge Catheter at skin depth: 11 cm Test dose: negative and Other (1% lidocaine)  Assessment Events: blood not aspirated, injection not painful, no injection resistance, no paresthesia and negative IV test  Additional Notes Patient identified. Risks, benefits, and alternatives discussed with patient including but not limited to bleeding, infection, nerve damage, paralysis, failed block, incomplete pain control, headache, blood pressure changes, nausea, vomiting, reactions to medication, itching, and postpartum back pain. Confirmed with bedside nurse the patient's most recent platelet count. Confirmed with patient that they are not currently taking any anticoagulation, have any bleeding history, or any family history of bleeding disorders. Patient expressed understanding and wished to proceed. All questions were answered. Sterile technique was used throughout the entire procedure. Please see nursing notes for vital signs.   Crisp LOR on first pass. Test dose was given through epidural catheter and negative prior to continuing to dose epidural or start infusion. Warning signs of high block given to the patient including shortness of breath, tingling/numbness in hands, complete  motor block, or any concerning symptoms with instructions to call for help. Patient was given instructions on fall risk and not to get out of bed. All questions and concerns addressed with instructions to call with any issues or inadequate analgesia.  Reason for block:procedure for pain

## 2022-03-01 ENCOUNTER — Encounter (HOSPITAL_COMMUNITY): Payer: Self-pay | Admitting: Obstetrics and Gynecology

## 2022-03-01 DIAGNOSIS — Z3490 Encounter for supervision of normal pregnancy, unspecified, unspecified trimester: Secondary | ICD-10-CM | POA: Diagnosis present

## 2022-03-01 DIAGNOSIS — O368390 Maternal care for abnormalities of the fetal heart rate or rhythm, unspecified trimester, not applicable or unspecified: Secondary | ICD-10-CM | POA: Diagnosis present

## 2022-03-01 LAB — CBC
HCT: 30.9 % — ABNORMAL LOW (ref 36.0–46.0)
Hemoglobin: 10.4 g/dL — ABNORMAL LOW (ref 12.0–15.0)
MCH: 30.9 pg (ref 26.0–34.0)
MCHC: 33.7 g/dL (ref 30.0–36.0)
MCV: 91.7 fL (ref 80.0–100.0)
Platelets: 196 10*3/uL (ref 150–400)
RBC: 3.37 MIL/uL — ABNORMAL LOW (ref 3.87–5.11)
RDW: 13.4 % (ref 11.5–15.5)
WBC: 15.4 10*3/uL — ABNORMAL HIGH (ref 4.0–10.5)
nRBC: 0 % (ref 0.0–0.2)

## 2022-03-01 MED ORDER — DIPHENHYDRAMINE HCL 25 MG PO CAPS: 25.0000 mg | ORAL_CAPSULE | Freq: Four times a day (QID) | ORAL | Status: DC | PRN

## 2022-03-01 MED ORDER — IBUPROFEN 600 MG PO TABS
600.0000 mg | ORAL_TABLET | Freq: Four times a day (QID) | ORAL | Status: DC
  Administered 2022-03-01 – 2022-03-03 (×9): 600 mg via ORAL
  Filled 2022-03-01 (×9): qty 1

## 2022-03-01 MED ORDER — SENNOSIDES-DOCUSATE SODIUM 8.6-50 MG PO TABS
2.0000 | ORAL_TABLET | Freq: Every day | ORAL | Status: DC
  Administered 2022-03-02 – 2022-03-03 (×2): 2 via ORAL
  Filled 2022-03-01 (×2): qty 2

## 2022-03-01 MED ORDER — ONDANSETRON HCL 4 MG PO TABS: 4.0000 mg | ORAL_TABLET | ORAL | Status: DC | PRN

## 2022-03-01 MED ORDER — ACETAMINOPHEN 325 MG PO TABS
650.0000 mg | ORAL_TABLET | ORAL | Status: DC | PRN
  Administered 2022-03-01 – 2022-03-02 (×3): 650 mg via ORAL
  Filled 2022-03-01 (×3): qty 2

## 2022-03-01 MED ORDER — TETANUS-DIPHTH-ACELL PERTUSSIS 5-2.5-18.5 LF-MCG/0.5 IM SUSY: 0.5000 mL | PREFILLED_SYRINGE | Freq: Once | INTRAMUSCULAR | Status: DC

## 2022-03-01 MED ORDER — PRENATAL MULTIVITAMIN CH
1.0000 | ORAL_TABLET | Freq: Every day | ORAL | Status: DC
  Administered 2022-03-01 – 2022-03-03 (×3): 1 via ORAL
  Filled 2022-03-01 (×2): qty 1

## 2022-03-01 MED ORDER — SIMETHICONE 80 MG PO CHEW: 80.0000 mg | CHEWABLE_TABLET | ORAL | Status: DC | PRN

## 2022-03-01 MED ORDER — COCONUT OIL OIL: 1.0000 | TOPICAL_OIL | Status: DC | PRN

## 2022-03-01 MED ORDER — DIBUCAINE (PERIANAL) 1 % EX OINT: 1.0000 | TOPICAL_OINTMENT | CUTANEOUS | Status: DC | PRN

## 2022-03-01 MED ORDER — ZOLPIDEM TARTRATE 5 MG PO TABS: 5.0000 mg | ORAL_TABLET | Freq: Every evening | ORAL | Status: DC | PRN

## 2022-03-01 MED ORDER — WITCH HAZEL-GLYCERIN EX PADS
1.0000 | MEDICATED_PAD | CUTANEOUS | Status: DC | PRN
  Administered 2022-03-01: 1 via TOPICAL

## 2022-03-01 MED ORDER — ONDANSETRON HCL 4 MG/2ML IJ SOLN: 4.0000 mg | INTRAMUSCULAR | Status: DC | PRN

## 2022-03-01 MED ORDER — BENZOCAINE-MENTHOL 20-0.5 % EX AERO
1.0000 | INHALATION_SPRAY | CUTANEOUS | Status: DC | PRN
  Administered 2022-03-01: 1 via TOPICAL
  Filled 2022-03-01: qty 56

## 2022-03-01 NOTE — Lactation Note (Signed)
This note was copied from a baby's chart. Lactation Consultation Note  Patient Name: Dawn Conley Today's Date: 03/01/2022 Reason for consult: Initial assessment;Term;Primapara;1st time breastfeeding Age:33 hours   P1: Term infant at 40+5 weeks Feeding preference: Breast/formula  "RJ" was in the nursery when I arrived.  Birth parent reported that he has had a low temperature.  Reviewed breast feeding basics with family.  Taught hand expression; no drops noted at this time.  Encouraged to continue lots of STS, breast massage and hand expression throughout the day.  Suggested birth parent call her RN/LC for latch assistance as needed.  While reviewing the flowsheets I noticed that there was a feeding noted at 0500 for 30 mls of formula.  Discussed this with parents.  They commented that their son has not received any formula yet and that he just recently left their room so that 0500 time could not have been correct.  Reviewed the supplementation guidelines with parents; will place a note for the pediatrician to alert that this amount was incorrect.    Maternal Data Has patient been taught Hand Expression?: Yes Does the patient have breastfeeding experience prior to this delivery?: No  Feeding Mother's Current Feeding Choice: Breast Milk and Formula Nipple Type: Slow - flow  LATCH Score                    Lactation Tools Discussed/Used    Interventions    Discharge Pump: Personal;Advised to call insurance company (Birth parent plans to call insurance company for pump)  Consult Status Consult Status: Follow-up Date: 03/02/22 Follow-up type: In-patient    Dawn Conley Dawn Conley Dawn Conley 03/01/2022, 8:05 AM

## 2022-03-01 NOTE — Anesthesia Postprocedure Evaluation (Signed)
Anesthesia Post Note  Patient: Dawn Conley  Procedure(s) Performed: AN AD HOC LABOR EPIDURAL     Patient location during evaluation: Mother Baby Anesthesia Type: Epidural Level of consciousness: awake and alert Pain management: pain level controlled Vital Signs Assessment: post-procedure vital signs reviewed and stable Respiratory status: spontaneous breathing, nonlabored ventilation and respiratory function stable Cardiovascular status: stable Postop Assessment: no headache, no backache, epidural receding, no apparent nausea or vomiting, patient able to bend at knees, adequate PO intake and able to ambulate Anesthetic complications: no   No notable events documented.  Last Vitals:  Vitals:   03/01/22 0230 03/01/22 0556  BP: 115/73 112/68  Pulse: 76 81  Resp: 18 18  Temp: 36.8 C 36.9 C  SpO2:      Last Pain:  Vitals:   03/01/22 0556  TempSrc: Oral  PainSc: 0-No pain   Pain Goal: Patients Stated Pain Goal: 0 (02/28/22 1532)                 Marques Ericson Hristova

## 2022-03-02 MED ORDER — IBUPROFEN 600 MG PO TABS: 600.0000 mg | ORAL_TABLET | Freq: Four times a day (QID) | ORAL | 0 refills | Status: AC | PRN

## 2022-03-02 MED ORDER — ACETAMINOPHEN 325 MG PO TABS: 650.0000 mg | ORAL_TABLET | ORAL | Status: AC | PRN

## 2022-03-02 NOTE — Progress Notes (Signed)
Post Partum Day 1 Subjective: no complaints, up ad lib, voiding, tolerating PO, and + flatus  Objective: Blood pressure 106/75, pulse 85, temperature 98 F (36.7 C), temperature source Oral, resp. rate 18, height 5\' 6"  (1.676 m), weight 110 kg, SpO2 99 %, unknown if currently breastfeeding.  Physical Exam:  General: alert, cooperative, and no distress Lochia: appropriate Uterine Fundus: firm Incision: NA DVT Evaluation: No evidence of DVT seen on physical exam.  Recent Labs    02/28/22 0808 03/01/22 0455  HGB 12.2 10.4*  HCT 35.2* 30.9*    Assessment/Plan: Plan for discharge tomorrow and Breastfeeding Patient desires circumcision of infant.  Routine postpartum care    LOS: 2 days   05/01/22, MD 03/02/2022, 11:53 AM

## 2022-03-02 NOTE — Lactation Note (Signed)
This note was copied from a baby's chart. Lactation Consultation Note  Patient Name: Dawn Conley Today's Date: 03/02/2022 Reason for consult: Follow-up assessment;Primapara;1st time breastfeeding;Term;Infant weight loss;Breastfeeding assistance (5.45% WL) Age:33 hours  P1, Term, Infant Female, 5.45% WL  LC entered and the birth parent was holding baby.  Per birth parent breastfeeding is not going well. She states that she has tried to hand express and has not seen anything.  LC spoke with the birth parent about supply and demand and milk production in the first few days postpartum.  LC also spoke with the birth parent about engorgement, infant I/O, and outpatient services.  The birth parent states that she has no further questions or concerns.  The birth parent will call RN/LC for assistance with latch.   Current Feeding Plan:  Breastfeed 8+ times in 24 hours according to feeding cues.  Put baby to the breast prior to supplementing baby.  Supplement according to bottle feeding guidelines.  Watch infant output and call the pediatrician with questions or concerns.  Call outpatient [facility] for assistance with breastfeeding.   Maternal Data    Feeding Mother's Current Feeding Choice: Breast Milk and Formula Nipple Type: Slow - flow  Interventions Interventions: Breast feeding basics reviewed;Education  Discharge    Consult Status Consult Status: Follow-up Date: 03/02/22 Follow-up type: In-patient    Delene Loll 03/02/2022, 9:38 AM

## 2022-03-03 NOTE — Lactation Note (Signed)
This note was copied from a baby's chart. Lactation Consultation Note  Patient Name: Dawn Conley Today's Date: 03/03/2022 Reason for consult: Follow-up assessment;Primapara;Term;1st time breastfeeding Age:33 hours   P1: Term infant at 40+5 weeks Feeding preference: Breast/formula  "RJ" was asleep on birth parent's chest when I arrived.  Questioned birth parent about her latching ability.  She informed me that "RJ" has not been latching well.  Discussed techniques and established a feeding plan for after discharge.  Set up a return visit this morning to assist with the next feeding to observe latching.  Birth parent stated she is still interested in breast feeding.  Birth parent has ordered her pump and stated that it should be at her house when she gets home today.  Emphasized the importance of allowing "RJ" to latch with every feeding prior to giving formula supplementation.  Suggested she pump after every feeding to help ensure a good milk supply.  Encouraged to continue breast massage and hand expression before/after pumping.  No support person present at this time.  Will provide further education and guidance when she calls for my assistance.   Maternal Data    Feeding Mother's Current Feeding Choice: Breast Milk and Formula  LATCH Score                    Lactation Tools Discussed/Used    Interventions    Discharge Discharge Education: Engorgement and breast care  Consult Status Consult Status: Follow-up Date: 03/03/22 Follow-up type: In-patient    Kiyana Vazguez R Genelle Economou 03/03/2022, 8:29 AM

## 2022-03-03 NOTE — Discharge Summary (Signed)
Postpartum Discharge Summary    Patient Name: Dawn Conley DOB: 18-Feb-1991 MRN: [id number]  Date of admission: 02/28/2022 Delivery date:03/01/2022  Delivering provider: Dale Union  Date of discharge: 03/03/2022  Admitting diagnosis: Normal labor [O80, Z37.9] Intrauterine pregnancy: [redacted]w[redacted]d     Secondary diagnosis:  Principal Problem:   Normal labor Active Problems:   SVD (spontaneous vaginal delivery)   Normal postpartum course   Encounter for induction of labor   Non-reassuring electronic fetal monitoring tracing  Additional problems: None    Discharge diagnosis: Term Pregnancy Delivered                                              Post partum procedures: None Augmentation: Pitocin Complications: None  Hospital course: Onset of Labor With Vaginal Delivery      31 y.o. yo [id number] at [redacted]w[redacted]d was admitted in Latent Labor on 02/28/2022. Patient had an uncomplicated labor course as follows:  Membrane Rupture Time/Date: 11:40 PM ,02/28/2022   Delivery Method:Vaginal, Spontaneous  Episiotomy: None  Lacerations:  2nd degree  Patient had an uncomplicated postpartum course.  She is ambulating, tolerating a regular diet, passing flatus, and urinating well. Patient is discharged home in stable condition on 03/03/22.  Newborn Data: Birth date:03/01/2022  Birth time:12:02 AM  Gender:Female  Living status:Living  Apgars:8 ,9  Weight:3760 g   Physical exam  Vitals:   03/02/22 0552 03/02/22 1455 03/02/22 2152 03/03/22 0451  BP: 106/75 119/84 123/79 118/68  Pulse: 85 87 92 98  Resp: 18 18 18 16   Temp: 98 F (36.7 C) 98.3 F (36.8 C) 98.2 F (36.8 C) 98.2 F (36.8 C)  TempSrc: Oral Oral Oral Oral  SpO2: 99% 100%    Weight:      Height:       General: alert, cooperative, and no distress Lochia: appropriate Uterine Fundus: firm Incision: N/A DVT Evaluation: No evidence of DVT seen on physical exam. No significant calf/ankle edema. Labs: Lab Results  Component Value Date   WBC  15.4 (H) 03/01/2022   HGB 10.4 (L) 03/01/2022   HCT 30.9 (L) 03/01/2022   MCV 91.7 03/01/2022   PLT 196 03/01/2022       No data to display         Edinburgh Score:    03/03/2022    7:06 AM  Edinburgh Postnatal Depression Scale Screening Tool  I have been able to laugh and see the funny side of things. 0  I have looked forward with enjoyment to things. 0  I have blamed myself unnecessarily when things went wrong. 0  I have been anxious or worried for no good reason. 0  I have felt scared or panicky for no good reason. 0  Things have been getting on top of me. 1  I have been so unhappy that I have had difficulty sleeping. 0  I have felt sad or miserable. 0  I have been so unhappy that I have been crying. 0  The thought of harming myself has occurred to me. 0  Edinburgh Postnatal Depression Scale Total 1     After visit meds:  Allergies as of 03/03/2022       Reactions   Aspirin Hives, Itching        Medication List     TAKE these medications    acetaminophen 325 MG tablet Commonly  known as: Tylenol Take 2 tablets (650 mg total) by mouth every 4 (four) hours as needed (for pain scale < 4).   ibuprofen 600 MG tablet Commonly known as: ADVIL Take 1 tablet (600 mg total) by mouth every 6 (six) hours as needed.   prenatal multivitamin Tabs tablet Take 1 tablet by mouth daily at 12 noon.        Discharge home in stable condition Infant Feeding: Breast Infant Disposition:home with mother Discharge instruction: per After Visit Summary and Postpartum booklet. Activity: Advance as tolerated. Pelvic rest for 6 weeks.  Diet: routine diet Future Appointments:No future appointments. Planned contraception: Unsure Follow up Visit:  Follow-up Information     Ob/Gyn, [place]. Schedule an appointment as soon as possible for a visit in 6 week(s).   Specialty: Obstetrics and Gynecology Why: 6 weeks postpartum follow up. Contact information: [address] [place] [address] [phone]                03/03/2022 Prescilla Sours, MD

## 2022-03-12 ENCOUNTER — Telehealth (HOSPITAL_COMMUNITY): Payer: Self-pay | Admitting: *Deleted

## 2022-03-12 NOTE — Telephone Encounter (Signed)
Mom reports feeling good. No concerns about herself at this time. EPDS=2 [facility] score=1) Mom reports baby is doing well. Feeding, peeing, and pooping without difficulty. Safe sleep reviewed. Mom reports no concerns about baby at present.  Duffy Rhody, RN 03-12-2022 at 11:32am
